# Patient Record
Sex: Male | Born: 1935 | Race: White | Hispanic: No | Marital: Married
Health system: Southern US, Community
[De-identification: ages and names within clinical notes are randomized; demographics above are authoritative.]

---

## 1998-09-18 ENCOUNTER — Encounter: Payer: Self-pay | Admitting: *Deleted

## 1998-09-18 ENCOUNTER — Ambulatory Visit (HOSPITAL_COMMUNITY): Admission: RE | Admit: 1998-09-18 | Discharge: 1998-09-18 | Payer: Self-pay

## 2003-07-14 ENCOUNTER — Other Ambulatory Visit: Payer: Self-pay

## 2003-07-20 ENCOUNTER — Other Ambulatory Visit: Payer: Self-pay

## 2003-07-21 ENCOUNTER — Other Ambulatory Visit: Payer: Self-pay

## 2003-07-22 ENCOUNTER — Other Ambulatory Visit: Payer: Self-pay

## 2003-07-23 ENCOUNTER — Other Ambulatory Visit: Payer: Self-pay

## 2005-06-11 ENCOUNTER — Ambulatory Visit: Payer: Self-pay | Admitting: Internal Medicine

## 2005-07-28 ENCOUNTER — Ambulatory Visit: Payer: Self-pay | Admitting: Urology

## 2005-10-09 ENCOUNTER — Ambulatory Visit: Payer: Self-pay | Admitting: Ophthalmology

## 2006-05-01 ENCOUNTER — Ambulatory Visit: Payer: Self-pay | Admitting: Internal Medicine

## 2006-05-31 ENCOUNTER — Emergency Department: Payer: Self-pay | Admitting: Emergency Medicine

## 2007-05-26 ENCOUNTER — Other Ambulatory Visit: Payer: Self-pay

## 2007-05-27 ENCOUNTER — Inpatient Hospital Stay: Payer: Self-pay | Admitting: Internal Medicine

## 2007-06-11 ENCOUNTER — Emergency Department: Payer: Self-pay | Admitting: Emergency Medicine

## 2007-06-11 ENCOUNTER — Other Ambulatory Visit: Payer: Self-pay

## 2007-08-13 ENCOUNTER — Inpatient Hospital Stay: Payer: Self-pay | Admitting: Internal Medicine

## 2007-08-14 ENCOUNTER — Other Ambulatory Visit: Payer: Self-pay

## 2008-06-22 ENCOUNTER — Ambulatory Visit: Payer: Self-pay | Admitting: Unknown Physician Specialty

## 2010-08-03 ENCOUNTER — Emergency Department: Payer: Self-pay | Admitting: Emergency Medicine

## 2010-08-06 ENCOUNTER — Other Ambulatory Visit: Payer: Self-pay | Admitting: Family Medicine

## 2010-08-06 ENCOUNTER — Ambulatory Visit
Admission: RE | Admit: 2010-08-06 | Discharge: 2010-08-06 | Disposition: A | Discharge status: Home / Self Care | Payer: Medicare Other | Source: Ambulatory Visit | Attending: Family Medicine | Admitting: Family Medicine

## 2010-08-06 DIAGNOSIS — M542 Cervicalgia: Secondary | ICD-10-CM

## 2010-08-06 DIAGNOSIS — M545 Low back pain: Secondary | ICD-10-CM

## 2010-12-15 IMAGING — CT CT CERVICAL SPINE WITHOUT CONTRAST
2 series · 10 of 14 positions shown, 12 images · non-contrast
Comparison: none

REASON FOR EXAM: neck pain RT side radiculopathy   pt has unknown metal
implant on neck RTside
COMMENTS:

[Series 5: axial · axial · 0.31mm/px · z∈[-713,-597]mm · 5 of 88 slices shown, 7 images]
[im 15/88  soft-tissue]
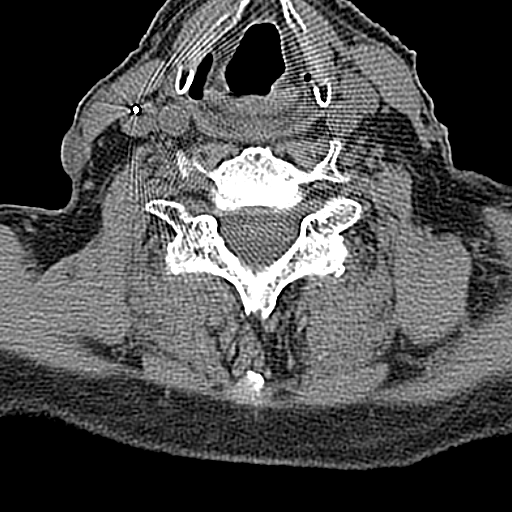
[im 15/88  bone]
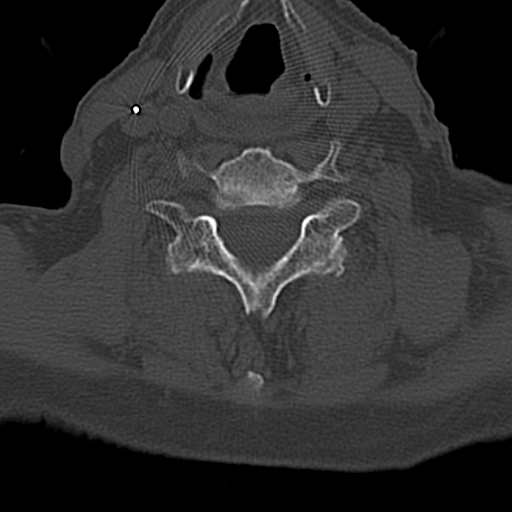
[im 30/88  bone]
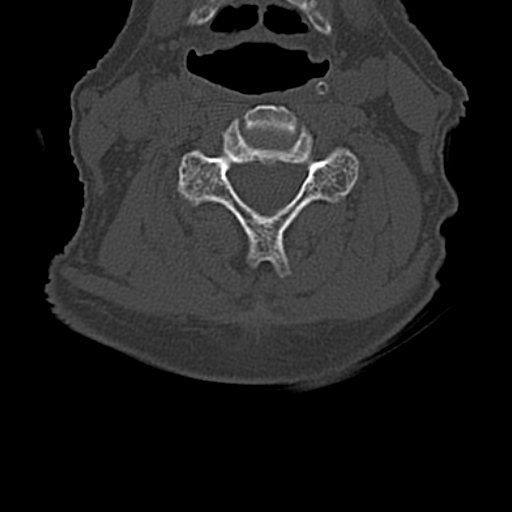
[im 44/88  bone]
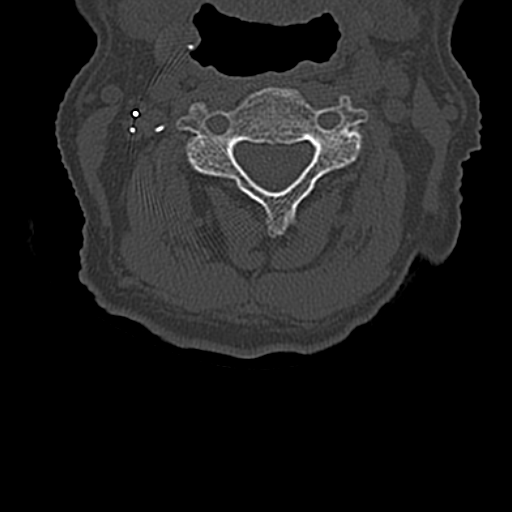
[im 59/88  bone]
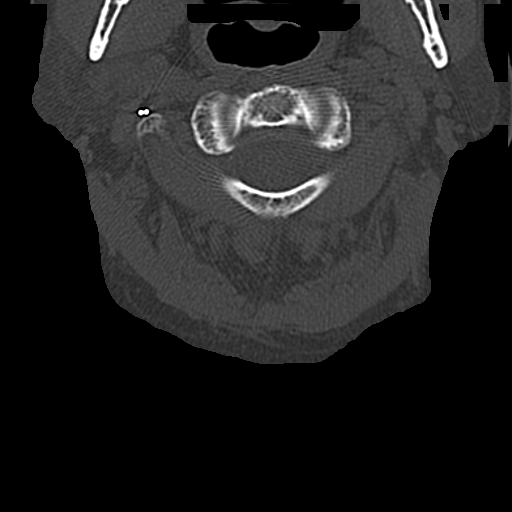
[im 73/88  soft-tissue]
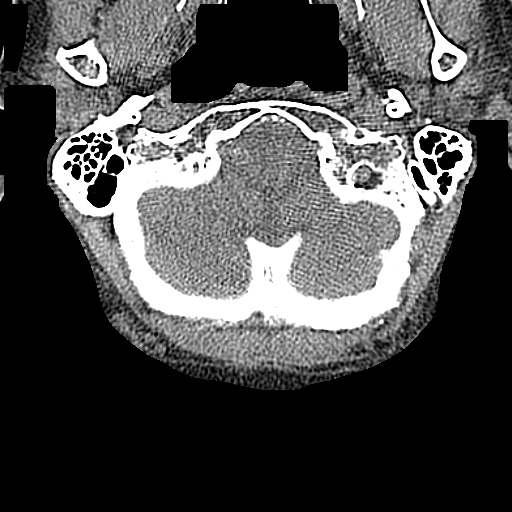
[im 73/88  bone]
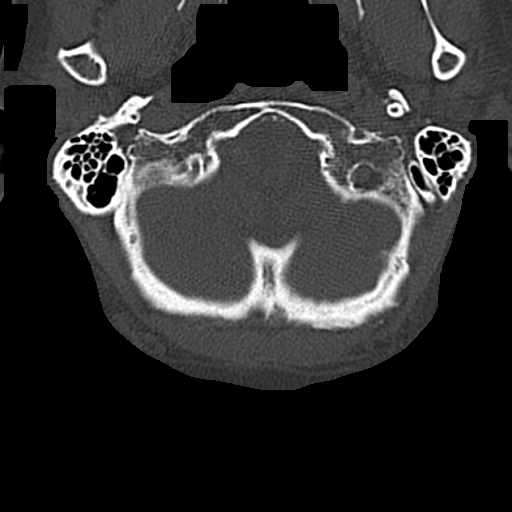

[Series 7: axial soft tissue · axial · 0.33mm/px · z∈[-725,-601]mm · 5 of 94 slices shown]
[im 16/94  soft-tissue]
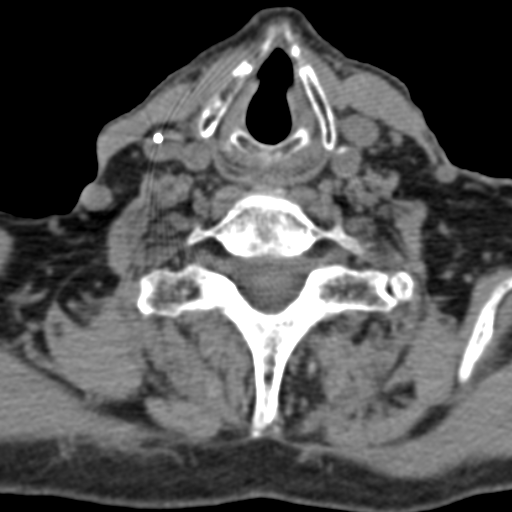
[im 32/94  soft-tissue]
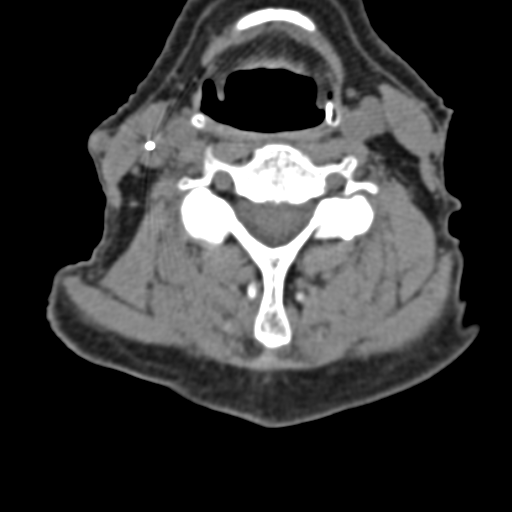
[im 47/94  soft-tissue]
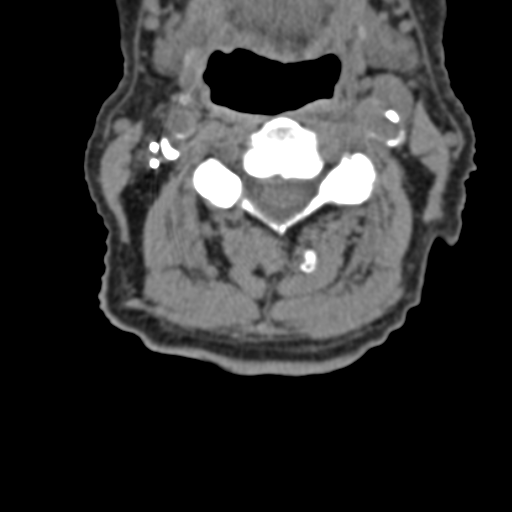
[im 63/94  soft-tissue]
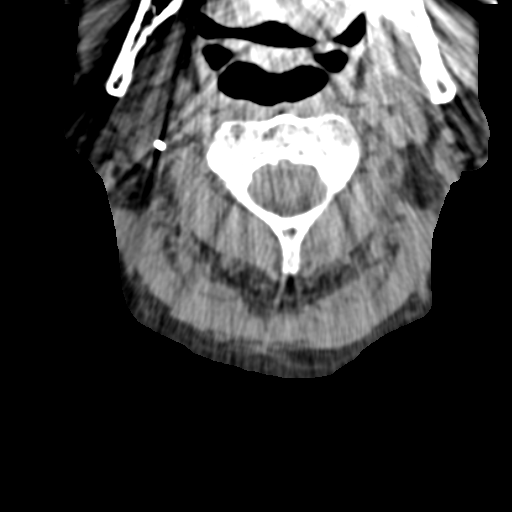
[im 78/94  soft-tissue]
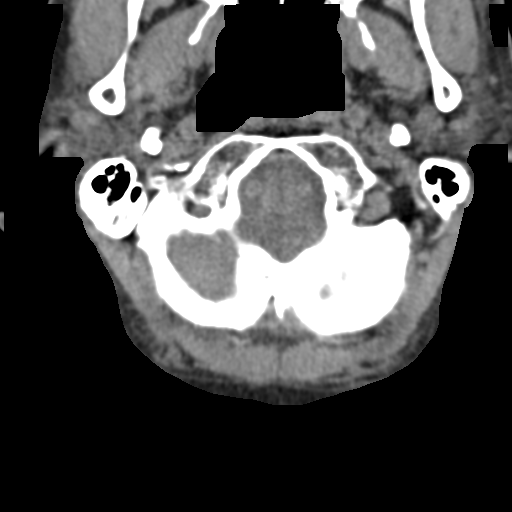

[10 of 14 positions shown; findings below may reference images not displayed]

PROCEDURE:     CT  - CT CERVICAL SPINE WO  - June 22, 2008 [DATE]

RESULT:     Sagittal, axial, and coronal images were obtained through the
cervical spine. The cervical vertebral bodies are preserved in height. There
is degenerative change of the C1 dash C2 interval anteriorly. The
prevertebral soft tissue spaces appear normal. The intervertebral disc space
heights are well-maintained. The posterior elements are intact. The bony
ring at each cervical level is intact. There is mild facet joint
degenerative change at multiple levels. Mild encroachment upon the neural
foramina is seen due to facet joint and endplate osteophyte at several
levels. This is developed felt to be appropriate for age. The lateral masses
of C1 align normally with those of C2.

There is a metallic wire-like structure which appears to lie within the
lumen of the internal jugular vein on the right. This may retain reflect a
retained vascular guidewire. Correlation with patient's clinical history
will be needed.
IMPRESSION: 1. There is mild to moderate age-appropriate degenerative change of the
cervical spine. The degenerative changes affect chiefly the facet joints. I
do not see evidence of high-grade neural foraminal encroachment by
osteophyte nor evidence of high-grade AP dimensional spinal stenosis.
2. No significant disc space narrowing is identified.
3. There are degenerative changes of the atlantodens articulation.

## 2011-05-21 ENCOUNTER — Inpatient Hospital Stay: Payer: Self-pay | Admitting: Internal Medicine

## 2011-05-21 LAB — CBC WITH DIFFERENTIAL/PLATELET
Basophil #: 0 10*3/uL (ref 0.0–0.1)
Basophil %: 0.9 %
Eosinophil #: 0.1 10*3/uL (ref 0.0–0.7)
Eosinophil %: 2.2 %
HCT: 28 % — ABNORMAL LOW (ref 40.0–52.0)
HGB: 8.9 g/dL — ABNORMAL LOW (ref 13.0–18.0)
Lymphocyte #: 0.6 10*3/uL — ABNORMAL LOW (ref 1.0–3.6)
Lymphocyte %: 12.1 %
MCH: 27 pg (ref 26.0–34.0)
MCHC: 31.7 g/dL — ABNORMAL LOW (ref 32.0–36.0)
MCV: 85 fL (ref 80–100)
Monocyte #: 0.6 10*3/uL (ref 0.0–0.7)
Monocyte %: 11.1 %
Neutrophil #: 3.9 10*3/uL (ref 1.4–6.5)
Neutrophil %: 73.7 %
Platelet: 124 10*3/uL — ABNORMAL LOW (ref 150–440)
RBC: 3.29 10*6/uL — ABNORMAL LOW (ref 4.40–5.90)
RDW: 20.1 % — ABNORMAL HIGH (ref 11.5–14.5)
WBC: 5.3 10*3/uL (ref 3.8–10.6)

## 2011-05-21 LAB — URINALYSIS, COMPLETE
Bacteria: NONE SEEN
Bilirubin,UR: NEGATIVE
Blood: NEGATIVE
Glucose,UR: NEGATIVE mg/dL (ref 0–75)
Hyaline Cast: 4
Ketone: NEGATIVE
Leukocyte Esterase: NEGATIVE
Nitrite: NEGATIVE
Ph: 5 (ref 4.5–8.0)
Protein: NEGATIVE
RBC,UR: NONE SEEN /HPF (ref 0–5)
Specific Gravity: 1.01 (ref 1.003–1.030)
Squamous Epithelial: NONE SEEN
WBC UR: <1 /HPF (ref 0–5)

## 2011-05-21 LAB — COMPREHENSIVE METABOLIC PANEL
Albumin: 3.5 g/dL (ref 3.4–5.0)
Alkaline Phosphatase: 48 U/L — ABNORMAL LOW (ref 50–136)
Anion Gap: 8 (ref 7–16)
BUN: 70 mg/dL — ABNORMAL HIGH (ref 7–18)
Bilirubin,Total: 0.4 mg/dL (ref 0.2–1.0)
Calcium, Total: 8.3 mg/dL — ABNORMAL LOW (ref 8.5–10.1)
Chloride: 115 mmol/L — ABNORMAL HIGH (ref 98–107)
Co2: 20 mmol/L — ABNORMAL LOW (ref 21–32)
Creatinine: 3.19 mg/dL — ABNORMAL HIGH (ref 0.60–1.30)
EGFR (African American): 24 — ABNORMAL LOW
EGFR (Non-African Amer.): 19 — ABNORMAL LOW
Glucose: 73 mg/dL (ref 65–99)
Osmolality: 304 (ref 275–301)
Potassium: 4.7 mmol/L (ref 3.5–5.1)
SGOT(AST): 16 U/L (ref 15–37)
SGPT (ALT): 15 U/L
Sodium: 143 mmol/L (ref 136–145)
Total Protein: 6.6 g/dL (ref 6.4–8.2)

## 2011-05-21 LAB — TSH: Thyroid Stimulating Horm: 4.16 u[IU]/mL

## 2011-05-21 LAB — SEDIMENTATION RATE: Erythrocyte Sed Rate: 17 mm/hr (ref 0–20)

## 2011-05-22 LAB — CBC WITH DIFFERENTIAL/PLATELET
Basophil #: 0 10*3/uL (ref 0.0–0.1)
Basophil %: 1 %
Eosinophil #: 0.1 10*3/uL (ref 0.0–0.7)
Eosinophil %: 2.8 %
HCT: 27.2 % — ABNORMAL LOW (ref 40.0–52.0)
HGB: 8.5 g/dL — ABNORMAL LOW (ref 13.0–18.0)
Lymphocyte #: 0.7 10*3/uL — ABNORMAL LOW (ref 1.0–3.6)
Lymphocyte %: 13.1 %
MCH: 26.7 pg (ref 26.0–34.0)
MCHC: 31.5 g/dL — ABNORMAL LOW (ref 32.0–36.0)
MCV: 85 fL (ref 80–100)
Monocyte #: 0.6 10*3/uL (ref 0.0–0.7)
Monocyte %: 13 %
Neutrophil #: 3.5 10*3/uL (ref 1.4–6.5)
Neutrophil %: 70.1 %
Platelet: 128 10*3/uL — ABNORMAL LOW (ref 150–440)
RBC: 3.2 10*6/uL — ABNORMAL LOW (ref 4.40–5.90)
RDW: 20.3 % — ABNORMAL HIGH (ref 11.5–14.5)
WBC: 5 10*3/uL (ref 3.8–10.6)

## 2011-05-22 LAB — BASIC METABOLIC PANEL
BUN: 71 mg/dL — ABNORMAL HIGH (ref 7–18)
Calcium, Total: 8 mg/dL — ABNORMAL LOW (ref 8.5–10.1)
Co2: 16 mmol/L — ABNORMAL LOW (ref 21–32)
Creatinine: 2.97 mg/dL — ABNORMAL HIGH (ref 0.60–1.30)
Potassium: 4.3 mmol/L (ref 3.5–5.1)
Sodium: 144 mmol/L (ref 136–145)

## 2011-05-22 LAB — CREATININE CLEARANCE, URINE, 24 HOUR
Collection Hours: 24 hours
Creatinine, Serum: 2.97 mg/dL — ABNORMAL HIGH (ref 0.50–1.20)
Total Volume: 1000 mL

## 2011-05-22 LAB — PHOSPHORUS: Phosphorus: 4.3 mg/dL (ref 2.5–4.9)

## 2011-05-22 LAB — IRON AND TIBC
Iron Bind.Cap.(Total): 340 ug/dL (ref 250–450)
Iron Saturation: 9 %
Unbound Iron-Bind.Cap.: 309 ug/dL

## 2011-05-22 LAB — PROTEIN / CREATININE RATIO, URINE
Creatinine, Urine: 44.6 mg/dL (ref 30.0–125.0)
Protein, Random Urine: 23 mg/dL — ABNORMAL HIGH (ref 0–12)

## 2011-05-22 LAB — FERRITIN: Ferritin (ARMC): 18 ng/mL (ref 8–388)

## 2011-07-25 ENCOUNTER — Emergency Department: Payer: Self-pay | Admitting: *Deleted

## 2012-07-08 DEATH — deceased

## 2013-11-13 IMAGING — US US RENAL KIDNEY
1 series · 17 of 25 positions shown · non-contrast
Comparison: none

REASON FOR EXAM: ARF, CKD
COMMENTS:

[Series 1: us renal kidney · 17 of 59 slices shown]
[im 1/59]
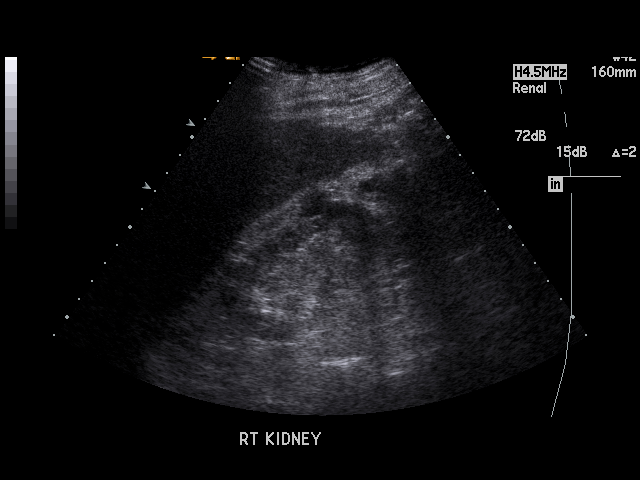
[im 5/59]
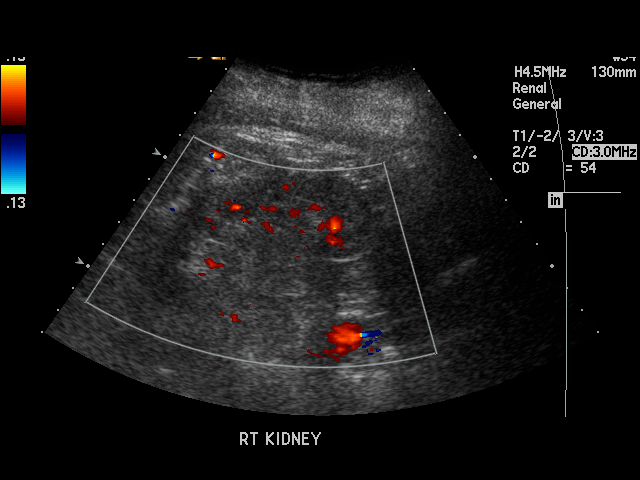
[im 8/59]
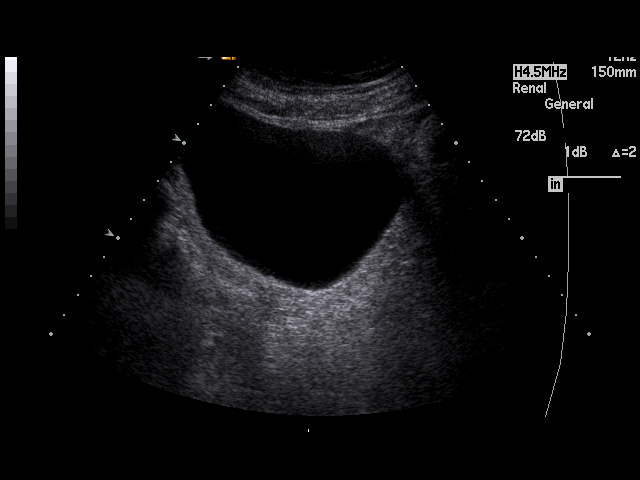
[im 13/59]
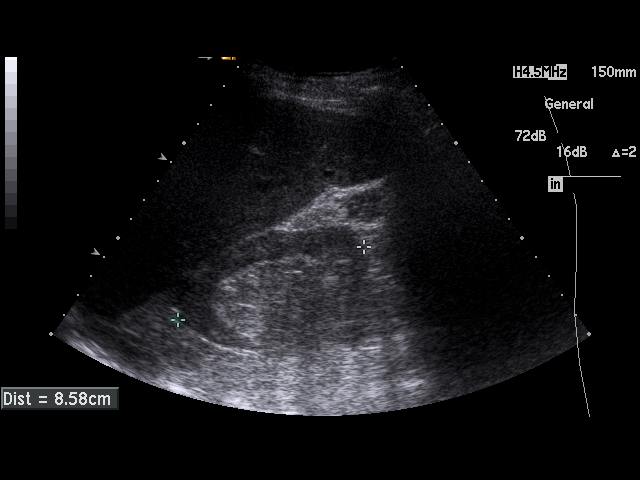
[im 15/59]
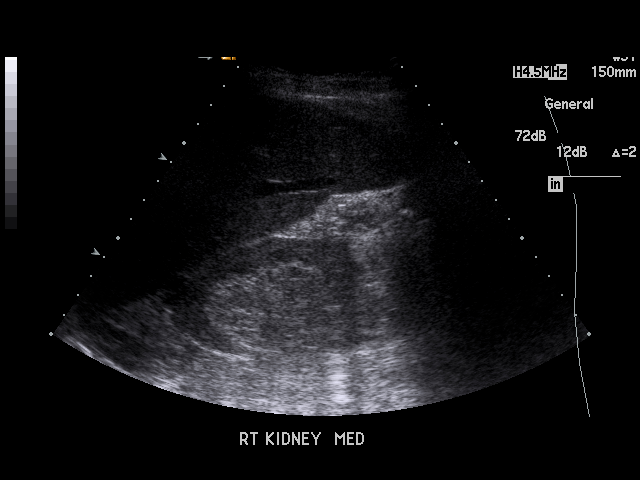
[im 20/59]
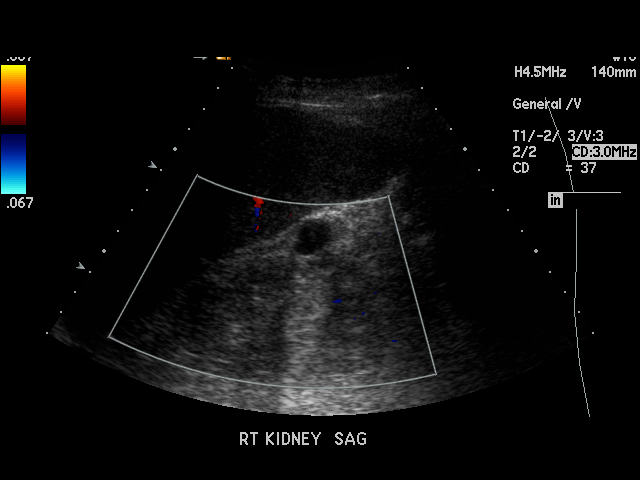
[im 22/59]
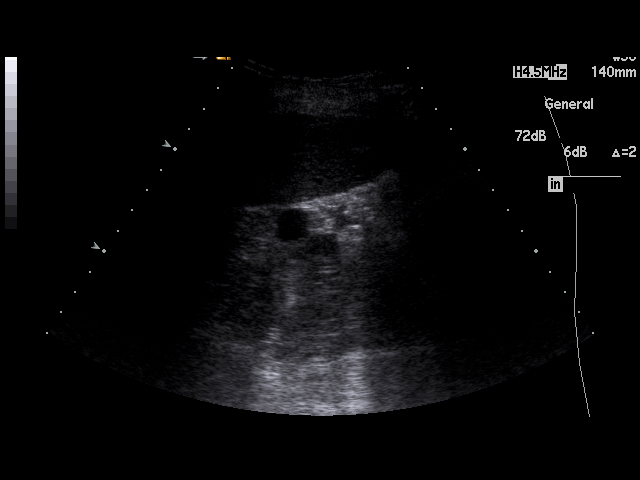
[im 27/59]
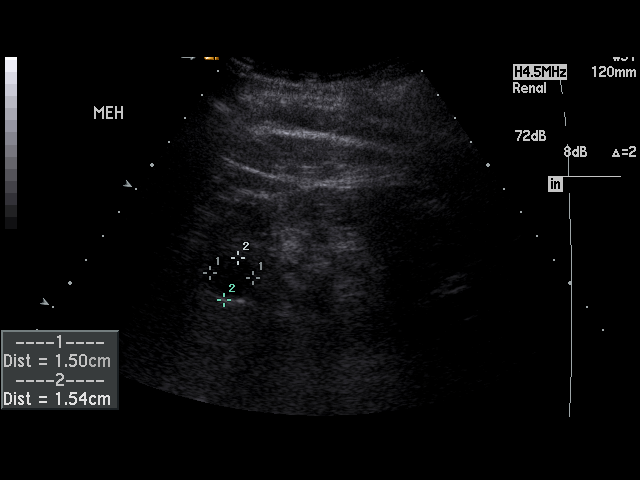
[im 30/59]
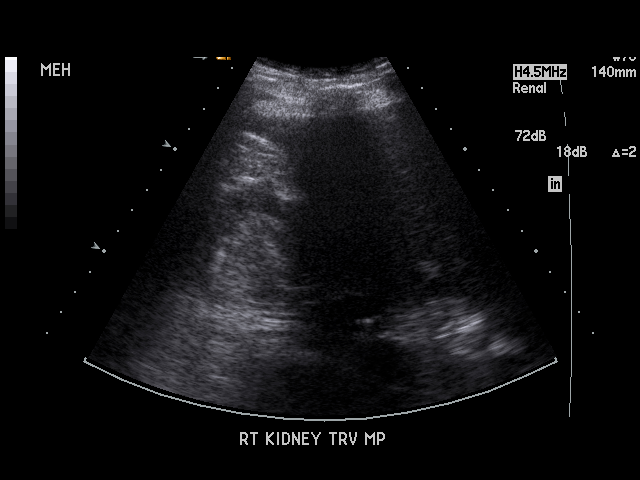
[im 32/59]
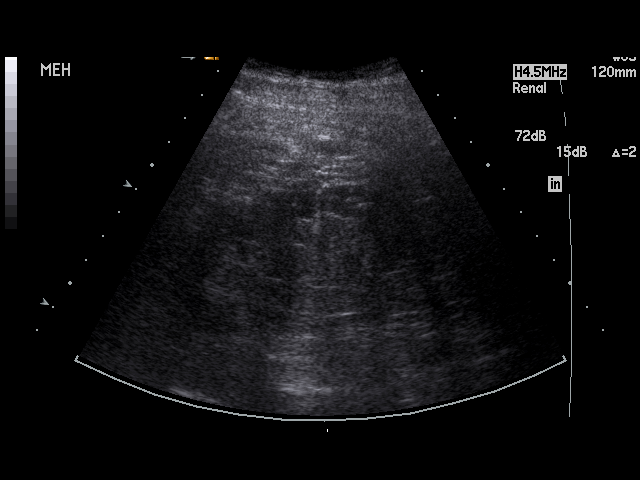
[im 37/59]
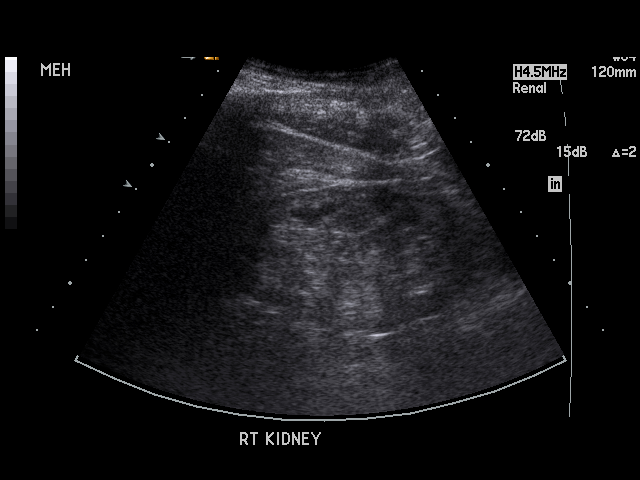
[im 39/59]
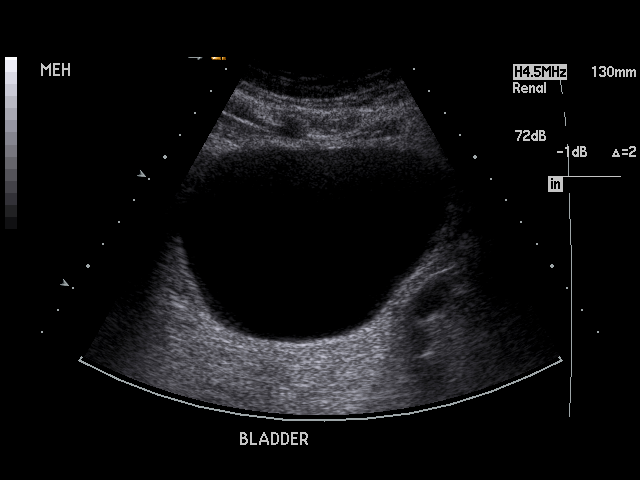
[im 44/59]
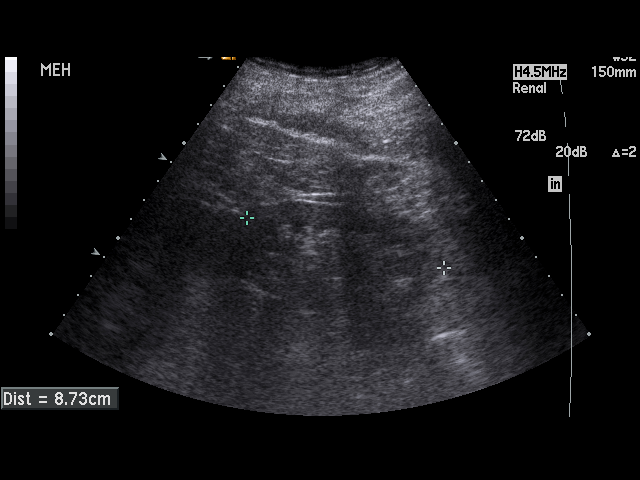
[im 46/59]
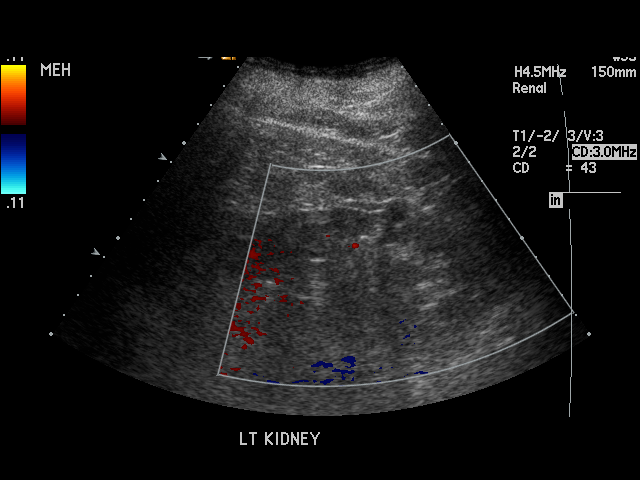
[im 51/59]
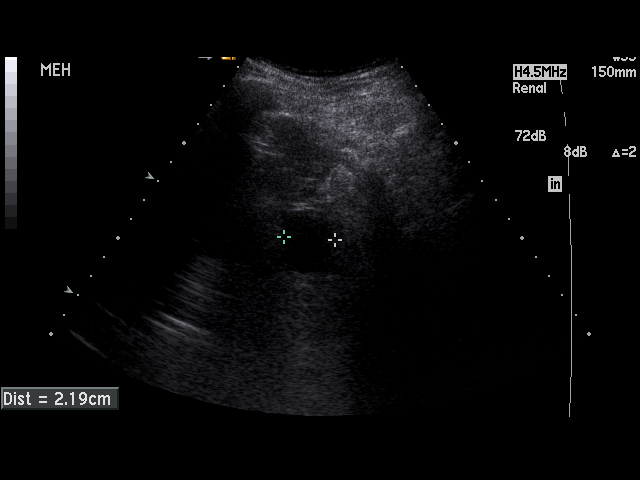
[im 54/59]
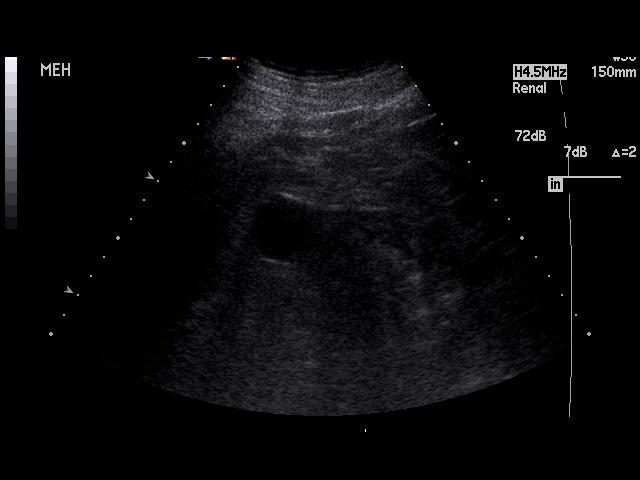
[im 59/59]
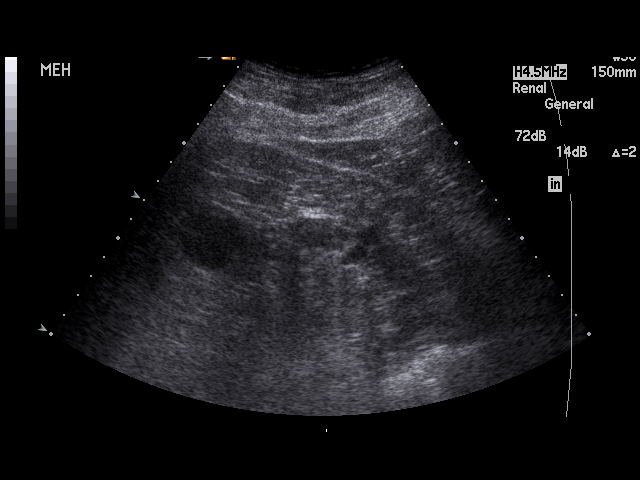

[17 of 25 positions shown; findings below may reference images not displayed]

PROCEDURE:     US  - US KIDNEY  - May 22, 2011 [DATE]

RESULT:     The right kidney measures 9.19 cm x 4.51 cm x 4.85 cm and the
left kidney measures 8.73 cm x 4.09 cm x 4.69 cm. The kidneys bilaterally
are hyperechogenic compatible with the clinical history of renal
insufficiency. Renal cortical margins bilaterally are smooth. There are
noted a few renal cysts bilaterally. The largest on the right measures
cm at maximum diameter and the largest on the left measures 3.1 cm at
maximum diameter and is located at the upper pole. No solid renal mass
lesions are seen on either side. No renal calcifications are identified.
There is no hydronephrosis. The visualized portion of the urinary bladder is
normal in appearance.
IMPRESSION: 1. No hydronephrosis or other acute change is identified.
2. A few renal cysts are noted bilaterally.
3. The kidneys are bilaterally hyperechogenic compatible with the clinical
history of renal insufficiency.

## 2014-07-02 NOTE — H&P (Signed)
PATIENT NAME:  Stuart Ramos, Stuart Ramos MR#:  [id number] DATE OF BIRTH:  1916-06-16  DATE OF ADMISSION:  05/21/2011  REFERRING PHYSICIAN: Dr. Candiss Norse PRIMARY CARE PHYSICIAN: Dr. Lavera Guise   ADMITTING DIAGNOSIS: Generalized weakness, weight loss, progressive worsening of renal functioning.  CHIEF COMPLAINT: Generalized weakness, weight loss, progressive renal failure.   HISTORY OF PRESENT ILLNESS: The patient is a 81 year old white male who sees Dr. Candiss Norse, who referred the patient for hospitalization due to worsening renal function as well as loss of 10 to 15 pounds of weight.  He has also had a fall with in the past one month and has generalized weakness. The patient feels that he is eating appropriately; however, his family thinks that he is not taking much food in. He particularly denies any problem with swallowing. He just states that he thinks that he is eating normally. He does not feel that he has a loss of appetite. Denies any abdominal pain, nausea, vomiting, or diarrhea. The patient also complains of having generalized weakness and had a fall within the last one month. He feels tired and fatigued. The patient also has been followed by Dr. Candiss Norse and is noted to have chronic kidney disease that has progressively worsened. He otherwise denies any chest pains or palpitations. No shortness of breath. No orthopnea. No urinary frequency, urgency, or hesitancy.   PAST MEDICAL HISTORY:  1. Hypertension for 30 years.  2. Atrial fibrillation.  3. Anemia.  4. Coronary artery bypass grafting times two, first in 1981 and then 2005.  5. History of claudication.  6. Chronic kidney disease stage IV.   PAST SURGICAL HISTORY:  1. Status post appendectomy.  2. Cholecystectomy.    CURRENT MEDICATIONS:  1. Amlodipine 2.5 mg daily.  2. Finasteride 5 mg daily.  3. Lasix 20 mg, 1 tab p.o. b.i.d.  4. Hydralazine 10 mg, 1 tab 4 times per day.  5. Isosorbide mononitrate 30 mg, 1 tab p.o. daily.  6. Lipitor 20 mg daily.     7. Metoprolol tartrate 50 mg, 1 tab p.o. daily.  8. Omeprazole 40 mg daily.  9. Sertraline 50 mg, 1 tab p.o. daily.  ALLERGIES: Codeine, erythromycin, and Percodan.   SOCIAL HISTORY: Lives in [place] with his wife. No alcohol or tobacco abuse.   FAMILY HISTORY: Son had a brain tumor. Brother had emphysema. Mother had cancer. Father deceased with brain hemorrhage.   REVIEW OF SYSTEMS: CONSTITUTIONAL: Complains of generalized weakness, fatigue, and weight loss. EYES: Denies any glaucoma. No cataracts. No visual difficulties. Denies any visual changes. ENT: Denies any tinnitus. No ear pain. No difficulty swallowing. RESPIRATORY: Denies any cough, wheezing, or hemoptysis. No dyspnea. No pneumonia. CARDIOVASCULAR: Denies any chest pains or palpitations. No syncope. Does have history of coronary artery disease. GASTROINTESTINAL: No nausea, vomiting, or diarrhea. No changes in bowel habits. No hematemesis. No hematochezia. GU: Denies any frequency, urgency, or hesitancy. SKIN: Denies any rash, changes in mole or hair or skin. MUSCULOSKELETAL: Has pain related to osteoarthritis. Complains of pain in his right shoulder with radiation to the elbow. VASCULAR: Has chronic claudication symptoms. ENDOCRINE: Denies any weight loss or weight gain. Denies any polyuria. No thyroid problems. NEURO: Denies any numbness, cerebrovascular accident, transient ischemic attack, or seizures. PSYCHIATRIC:  Has history of depression.   PHYSICAL EXAMINATION:  VITAL SIGNS: Temperature 97.5, blood pressure 129/57, pulse 70, oxygen saturation 99% on room air.   GENERAL: The patient is an elderly male in no acute distress.   HEENT: Head atraumatic, normocephalic. Pupils equally  round and reactive to light and accommodation. Extraocular movements are intact. There is no conjunctival pallor. No scleral icterus. Oropharynx is clear without any exudates. Ear exam shows no drainage or ulceration. Nasal exam shows no ulceration or  drainage.   NECK: There is no thyromegaly. No carotid bruits.   CARDIOVASCULAR: Regular rate and rhythm. He has a systolic murmur at the left sternal border. No gallops.   ABDOMEN: Soft, nondistended, positive bowel sounds times four. No hepatosplenomegaly.   EXTREMITIES: No clubbing, cyanosis, edema.   SKIN: No rash.   LYMPHATICS: No lymph nodes palpable.   MUSCULOSKELETAL: There is no erythema or swelling.   VASCULAR: Good DP, PT pulses.   NEUROLOGICAL: Awake, alert, oriented times three. No focal deficits.   LABS/STUDIES:  Glucose 73. BUN 70. Creatinine 3.19, sodium 143, potassium 4.7, chloride 115, CO2 20. GFR 19, calcium 8.3. LFTs:  Total protein 6.6, albumin 3.5, bilirubin total 0.4, AST 16. TSH is 4.16.   ASSESSMENT AND PLAN: The patient is a 81 year old white male sent for direct admission from nephrology for progressive weakness, weight loss, and worsening renal function.  1. Generalized weakness, weight loss:  Likely multifactorial. Could also be age-related. At this time we will give him low dose IV fluids. Will have physical therapy evaluation and treatment. We will check ESR. His TSH is okay. We will check a B12 level as well.  2. Progressive worsening of renal function: Nephrology to see. Dr. Candiss Norse to discuss about possible hemodialysis.  3. Atrial fibrillation: We will continue metoprolol. Place him on low dose aspirin.  4. Hypertension: Continue amlodipine, hydralazine, and Imdur.  5. Coronary artery disease:  Place him on aspirin. Continue isosorbide mononitrate and metoprolol.  6. Heart murmur:  Dr. Lavera Guise to assume his care to decide whether he needs an echocardiogram or not. He may have had this in his office.  7. Miscellaneous:  We will place him on heparin for deep vein thrombosis prophylaxis.    TIME SPENT: 35 minutes.    ____________________________ Lafonda Mosses Posey Pronto, MD shp:bjt D: 05/21/2011 11:59:10 ET T: 05/21/2011 12:20:03  ET JOB#: [id number]  cc: Dontavion Noxon H. Posey Pronto, MD, <Dictator> Cletis Athens, MD Alric Seton MD ELECTRONICALLY SIGNED 05/24/2011 13:13

## 2014-07-02 NOTE — Discharge Summary (Signed)
PATIENT NAME:  Stuart Ramos, Stuart Ramos DATE OF BIRTH:  07-Nov-1916  DATE OF ADMISSION:  05/21/2011 DATE OF DISCHARGE:  05/23/2011  HISTORY OF PRESENT ILLNESS: Patient was admitted into the hospital with weight loss, worsening of renal function and generalized weakness. For rest of details of history and physical, please see typed history and physical sheet. Patient is known to have hypertension, atrial fibrillation, coronary artery disease, and chronic kidney disease stage IV. He also is known to have claudication.   ADMITTING DIAGNOSES:  1. Generalized weakness.  2. Progressive worsening of renal function. 3. Atrial fibrillation. 4. Hypertension. 5. Coronary artery disease. 6. Aortic stenosis.   HOSPITAL COURSE: Patient was admitted into the hospital. His BUN was 70, creatinine 3.19, chloride 115. Sedimentation rate 17, hemoglobin 8.5. He was seen in consultation by nephrologist who thought that patient's creatinine has improved a little bit after two days. He is having symptoms of uremia and weakness and he suggested the patient should undergo hemodialysis. There is no evidence of hydronephrosis noted on the ultrasound of the kidney. Patient, however, declined hemodialysis and wanted to go home and think about it.   FINAL DIAGNOSES:  1. Acute renal failure. 2. Chronic kidney disease stage IV. 3. Weight loss of 20 pounds. 4. Anemia. 5. Hypertension. 6. Aortic stenosis.   DISCHARGE MEDICATIONS: Patient was discharged home on: 1. Metoprolol 50 mg 1 tablet p.o. twice a day. 2. Imdur 30 mg p.o. daily.  3. Zoloft 50 mg p.o. daily.  4. Nitroglycerin 0.4 mg sublingually p.r.n. for chest pain. 5. Lasix 20 mg p.o. daily.  6. Omeprazole 20 mg 1 tablet twice a day. 7. Folic acid 0.4 mg tablet 1 orally once a day. 8. Finasteride 5 mg 1 tablet p.o. daily.  9. Lipitor 20 mg p.o. daily.   NOTE: After discussion with the nephrologist it was decided to stop the metoprolol and put him on  Nadolol 10 mg p.o. daily and also added Megace 400 mg p.o. twice a day liquid and Bicitra solution 30 mL p.o. b.i.d.   DIET: Low sodium diet.   ____________________________ Corky DownsJaved Luc Shammas, MD jm:cms D: 06/03/2011 20:02:26 ET T: 06/04/2011 09:58:16 ET JOB#: [id number]  cc: Corky DownsJaved Menna Abeln, MD, <Dictator> Corky DownsJAVED Ahnesti Townsend MD ELECTRONICALLY SIGNED 06/04/2011 13:38
